# Patient Record
Sex: Male | Born: 1985 | Race: White | Hispanic: No | Marital: Single | Smoking: Current every day smoker
Health system: Southern US, Community
[De-identification: ages and names within clinical notes are randomized; demographics above are authoritative.]

## PROBLEM LIST (undated history)

## (undated) DIAGNOSIS — F419 Anxiety disorder, unspecified: Secondary | ICD-10-CM

---

## 2006-09-11 ENCOUNTER — Emergency Department (HOSPITAL_COMMUNITY): Admission: EM | Admit: 2006-09-11 | Discharge: 2006-09-11 | Payer: Self-pay | Admitting: Emergency Medicine

## 2010-12-25 ENCOUNTER — Other Ambulatory Visit: Payer: Self-pay | Admitting: Internal Medicine

## 2010-12-28 ENCOUNTER — Ambulatory Visit
Admission: RE | Admit: 2010-12-28 | Discharge: 2010-12-28 | Disposition: A | Discharge status: Home / Self Care | Payer: No Typology Code available for payment source | Source: Ambulatory Visit | Attending: Internal Medicine | Admitting: Internal Medicine

## 2011-03-10 ENCOUNTER — Emergency Department (HOSPITAL_COMMUNITY)
Admission: EM | Admit: 2011-03-10 | Discharge: 2011-03-10 | Disposition: A | Discharge status: Home / Self Care | Payer: Self-pay | Attending: Emergency Medicine | Admitting: Emergency Medicine

## 2011-03-10 ENCOUNTER — Emergency Department (HOSPITAL_COMMUNITY): Payer: Self-pay

## 2011-03-10 DIAGNOSIS — X58XXXA Exposure to other specified factors, initial encounter: Secondary | ICD-10-CM | POA: Insufficient documentation

## 2011-03-10 DIAGNOSIS — IMO0002 Reserved for concepts with insufficient information to code with codable children: Secondary | ICD-10-CM | POA: Insufficient documentation

## 2011-03-10 DIAGNOSIS — M25519 Pain in unspecified shoulder: Secondary | ICD-10-CM | POA: Insufficient documentation

## 2011-06-20 ENCOUNTER — Emergency Department (HOSPITAL_COMMUNITY): Payer: Self-pay

## 2011-06-20 ENCOUNTER — Other Ambulatory Visit: Payer: Self-pay

## 2011-06-20 ENCOUNTER — Emergency Department (HOSPITAL_COMMUNITY)
Admission: EM | Admit: 2011-06-20 | Discharge: 2011-06-21 | Disposition: A | Discharge status: Home / Self Care | Payer: Self-pay | Attending: Emergency Medicine | Admitting: Emergency Medicine

## 2011-06-20 DIAGNOSIS — F17200 Nicotine dependence, unspecified, uncomplicated: Secondary | ICD-10-CM | POA: Insufficient documentation

## 2011-06-20 DIAGNOSIS — R079 Chest pain, unspecified: Secondary | ICD-10-CM | POA: Insufficient documentation

## 2011-06-20 DIAGNOSIS — R0602 Shortness of breath: Secondary | ICD-10-CM | POA: Insufficient documentation

## 2011-06-20 DIAGNOSIS — F410 Panic disorder [episodic paroxysmal anxiety] without agoraphobia: Secondary | ICD-10-CM | POA: Insufficient documentation

## 2011-06-20 DIAGNOSIS — R000 Tachycardia, unspecified: Secondary | ICD-10-CM | POA: Insufficient documentation

## 2011-06-20 MED ORDER — LORAZEPAM 2 MG/ML IJ SOLN
INTRAMUSCULAR | Status: AC
  Administered 2011-06-20: 2 mg
  Filled 2011-06-20: qty 1

## 2011-06-20 MED ORDER — ASPIRIN 325 MG PO TABS
325.0000 mg | ORAL_TABLET | ORAL | Status: AC
  Administered 2011-06-20: 325 mg via ORAL
  Filled 2011-06-20: qty 1

## 2011-06-20 MED ORDER — NITROGLYCERIN 0.4 MG SL SUBL: 0.4000 mg | SUBLINGUAL_TABLET | SUBLINGUAL | Status: DC | PRN

## 2011-06-20 NOTE — ED Provider Notes (Signed)
History     CSN: [id number] Arrival date & time: 06/20/2011 11:17 PM   First MD Initiated Contact with Patient 06/20/11 2338      Chief Complaint  Patient presents with  . Chest Pain    (Consider location/radiation/quality/duration/timing/severity/associated sxs/prior treatment) HPI This is a 25 year old Arabic male who was eating dinner just prior to arrival. He stood up and suddenly developed chest pain. The pain is located substernally and the left upper chest. It is described as pressure or tightness is moderate to severe in intensity. It is accompanied by shortness of breath, hyperventilation and tingling in his fingertips. It is also accompanied by tachycardia and anxiety. There are no known exacerbating or mitigating factors.   History reviewed. No pertinent past medical history.  History reviewed. No pertinent past surgical history.  History reviewed. No pertinent family history.  History  Substance Use Topics  . Smoking status: Current Everyday Smoker  . Smokeless tobacco: Not on file  . Alcohol Use: Yes      Review of Systems  All other systems reviewed and are negative.     Allergies  Review of patient's allergies indicates no known allergies.  Home Medications  No current outpatient prescriptions on file.  BP 127/62  Pulse 103  Temp(Src) 98.7 F (37.1 C) (Oral)  Resp 26  SpO2 98%  Physical Exam General: Well-developed, well-nourished male in no acute distress; appearance consistent with age of record HENT: normocephalic, atraumatic Eyes: pupils equal round and reactive to light; extraocular muscles intact Neck: supple Heart: regular rate and rhythm; no murmurs; tachycardic Lungs: clear to auscultation bilaterally; tachypnea Abdomen: soft; nontender; nondistended Extremities: No deformity; full range of motion; pulses normal Neurologic: Awake, alert; motor function intact in all extremities and symmetric; no facial droop Skin: Warm and  dry Psychiatric: Anxious; hyperventilating    ED Course  Procedures (including critical care time)    MDM   EKG Interpretation:  Date & Time: 06/20/2011 11:19 PM  Rate: 133  Rhythm: sinus tachycardia  QRS Axis: normal  Intervals: normal  ST/T Wave abnormalities: normal  Conduction Disutrbances:none  Narrative Interpretation:   Old EKG Reviewed: none available  Nursing notes and vitals signs, including pulse oximetry, reviewed.  Summary of this visit's results, reviewed by myself:  Labs:  Results for orders placed during the hospital encounter of 06/20/11  CBC      Component Value Range   WBC 16.9 (*) 4.0 - 10.5 (K/uL)   RBC 6.10 (*) 4.22 - 5.81 (MIL/uL)   Hemoglobin 19.6 (*) 13.0 - 17.0 (g/dL)   HCT 40.9 (*) 81.1 - 52.0 (%)   MCV 86.6  78.0 - 100.0 (fL)   MCH 32.1  26.0 - 34.0 (pg)   MCHC 37.1 (*) 30.0 - 36.0 (g/dL)   RDW 91.4  78.2 - 95.6 (%)   Platelets 176  150 - 400 (K/uL)  BASIC METABOLIC PANEL      Component Value Range   Sodium 135  135 - 145 (mEq/L)   Potassium 3.6  3.5 - 5.1 (mEq/L)   Chloride 96  96 - 112 (mEq/L)   CO2 19  19 - 32 (mEq/L)   Glucose, Bld 136 (*) 70 - 99 (mg/dL)   BUN 8  6 - 23 (mg/dL)   Creatinine, Ser 2.13  0.50 - 1.35 (mg/dL)   Calcium 08.6 (*) 8.4 - 10.5 (mg/dL)   GFR calc non Af Amer >90  >90 (mL/min)   GFR calc Af Amer >90  >90 (mL/min)  PRO B NATRIURETIC PEPTIDE      Component Value Range   BNP, POC 5.0  0 - 125 (pg/mL)  D-DIMER, QUANTITATIVE      Component Value Range   D-Dimer, Quant <0.22  0.00 - 0.48 (ug/mL-FEU)  POCT I-STAT TROPONIN I      Component Value Range   Troponin i, poc 0.00  0.00 - 0.08 (ng/mL)   Comment 3             Imaging Studies: Dg Chest 2 View  06/21/2011  *RADIOLOGY REPORT*  Clinical Data: 25 year old male with chest pain and shortness of breath.  CHEST - 2 VIEW  Comparison: None.  Findings: Upright AP and lateral views of the chest.  Mild elevation of the right hemidiaphragm.  Normal lung  volumes. Normal cardiac size and mediastinal contours.  The lungs are clear.  No pneumothorax or effusion. No acute osseous abnormality identified.  IMPRESSION: Negative, no acute cardiopulmonary abnormality.  Original Report Authenticated By: Harley Hallmark, M.D.   2:30 AM Patient symptoms resolved after Ativan 1 mg IV. He remains tachycardic but less so than earlier. No evidence of acute cardiac or pulmonary etiology.             Hanley Seamen, MD 06/21/11 [phone]

## 2011-06-20 NOTE — ED Notes (Signed)
Pt was sitting somewhere and stood up suddenly grabbing his chest and complaining of pain and shortness of breath

## 2011-06-21 LAB — POCT I-STAT TROPONIN I: Troponin i, poc: 0 ng/mL (ref 0.00–0.08)

## 2011-06-21 LAB — CBC
HCT: 52.8 % — ABNORMAL HIGH (ref 39.0–52.0)
Platelets: 176 10*3/uL (ref 150–400)
RDW: 12 % (ref 11.5–15.5)
WBC: 16.9 10*3/uL — ABNORMAL HIGH (ref 4.0–10.5)

## 2011-06-21 LAB — BASIC METABOLIC PANEL
BUN: 8 mg/dL (ref 6–23)
Chloride: 96 mEq/L (ref 96–112)
GFR calc Af Amer: >90 mL/min (ref >90)
Potassium: 3.6 mEq/L (ref 3.5–5.1)

## 2011-06-21 LAB — PRO B NATRIURETIC PEPTIDE: Pro B Natriuretic peptide (BNP): 5 pg/mL (ref 0–125)

## 2011-06-21 LAB — D-DIMER, QUANTITATIVE: D-Dimer, Quant: <0.22 ug/mL-FEU (ref 0.00–0.48)

## 2011-06-21 NOTE — ED Notes (Signed)
Pt given discharge instructions and verbalizes understanding  

## 2011-06-21 NOTE — ED Notes (Signed)
Pt resting on stretcher. Pt states he feels a lot better. Pt cont to await further dispo. Family at bedside. Will cont to monitor

## 2011-06-21 NOTE — ED Notes (Signed)
Patient denies pain and is resting comfortably.  

## 2011-06-21 NOTE — ED Notes (Signed)
Pt transported to radiology via stretcher. Pt stable at time of transfer  

## 2011-08-10 ENCOUNTER — Other Ambulatory Visit: Payer: Self-pay

## 2011-08-10 ENCOUNTER — Encounter (HOSPITAL_COMMUNITY): Payer: Self-pay | Admitting: *Deleted

## 2011-08-10 ENCOUNTER — Emergency Department (HOSPITAL_COMMUNITY)
Admission: EM | Admit: 2011-08-10 | Discharge: 2011-08-11 | Disposition: A | Discharge status: Home / Self Care | Payer: Self-pay | Attending: Emergency Medicine | Admitting: Emergency Medicine

## 2011-08-10 DIAGNOSIS — R5383 Other fatigue: Secondary | ICD-10-CM | POA: Insufficient documentation

## 2011-08-10 DIAGNOSIS — F17200 Nicotine dependence, unspecified, uncomplicated: Secondary | ICD-10-CM | POA: Insufficient documentation

## 2011-08-10 DIAGNOSIS — F411 Generalized anxiety disorder: Secondary | ICD-10-CM | POA: Insufficient documentation

## 2011-08-10 DIAGNOSIS — R5381 Other malaise: Secondary | ICD-10-CM | POA: Insufficient documentation

## 2011-08-10 DIAGNOSIS — R42 Dizziness and giddiness: Secondary | ICD-10-CM | POA: Insufficient documentation

## 2011-08-10 DIAGNOSIS — R634 Abnormal weight loss: Secondary | ICD-10-CM | POA: Insufficient documentation

## 2011-08-10 HISTORY — DX: Anxiety disorder, unspecified: F41.9

## 2011-08-10 LAB — POCT I-STAT, CHEM 8
HCT: 54 % — ABNORMAL HIGH (ref 39.0–52.0)
Hemoglobin: 18.4 g/dL — ABNORMAL HIGH (ref 13.0–17.0)
Potassium: 4 mEq/L (ref 3.5–5.1)
Sodium: 140 mEq/L (ref 135–145)

## 2011-08-10 LAB — CBC
MCHC: 35.4 g/dL (ref 30.0–36.0)
Platelets: 266 10*3/uL (ref 150–400)
RDW: 12.4 % (ref 11.5–15.5)

## 2011-08-10 NOTE — ED Notes (Signed)
Pt states he is dizzy x 1 day and is lethargic.  Pt also complains of right leg trembling.  Pt confirms hx of panic and anxiety.  Pt cannot recall what in particular made him have said panic or anxiety today.

## 2011-08-10 NOTE — ED Provider Notes (Signed)
History     CSN: [id number]  Arrival date & time 08/10/11  2052   First MD Initiated Contact with Patient 08/10/11 2158      Chief Complaint  Patient presents with  . Weakness  . Dizziness    (Consider location/radiation/quality/duration/timing/severity/associated sxs/prior treatment) HPI Comments: Patient states chief complaint of dizziness and lethargic times one day.  He states he has a history of anxiety however this does not feel like anxiety 10.  Patient is a current smoker.  In addition patient states that he has had weight loss over the last 6 months.  Patient denies change in appetite, shortness of breath, leg swelling, headache, nausea, vomiting, diarrhea or abdominal pain.  Patient is a 26 y.o. male presenting with weakness. The history is provided by the patient.  Weakness The primary symptoms include dizziness. Primary symptoms do not include headaches, syncope, loss of consciousness, altered mental status, seizures, visual change, paresthesias, focal weakness, loss of sensation, speech change, memory loss, fever, nausea or vomiting.  Dizziness also occurs with weakness. Dizziness does not occur with tinnitus, nausea or vomiting.   Additional symptoms include weakness. Additional symptoms do not include neck stiffness, pain, lower back pain, leg pain, loss of balance, photophobia, aura, hallucinations, nystagmus, taste disturbance, hyperacusis, hearing loss, tinnitus, vertigo, anxiety, irritability or dysphoric mood. Medical issues do not include seizures, cerebral vascular accident, cancer, alcohol use, drug use, diabetes, hypertension or recent surgery.    Past Medical History  Diagnosis Date  . Anxiety     and panic    History reviewed. No pertinent past surgical history.  History reviewed. No pertinent family history.  History  Substance Use Topics  . Smoking status: Current Everyday Smoker -- 0.5 packs/day for 12 years    Types: Cigarettes  . Smokeless  tobacco: Not on file  . Alcohol Use: No      Review of Systems  Constitutional: Positive for unexpected weight change. Negative for fever and irritability.  HENT: Negative for hearing loss, neck stiffness and tinnitus.   Eyes: Negative for photophobia.  Cardiovascular: Negative for syncope.  Gastrointestinal: Negative for nausea and vomiting.  Neurological: Positive for dizziness and weakness. Negative for vertigo, speech change, focal weakness, seizures, loss of consciousness, headaches, paresthesias and loss of balance.  Psychiatric/Behavioral: Negative for hallucinations, memory loss, dysphoric mood and altered mental status.    Allergies  Review of patient's allergies indicates no known allergies.  Home Medications  No current outpatient prescriptions on file.  BP 135/76  Pulse 93  Temp(Src) 97.8 F (36.6 C) (Oral)  Resp 16  SpO2 100%  Physical Exam  ED Course  Procedures (including critical care time)  Labs Reviewed  CBC - Abnormal; Notable for the following:    WBC 14.2 (*)    Hemoglobin 17.5 (*)    All other components within normal limits  POCT I-STAT, CHEM 8 - Abnormal; Notable for the following:    Glucose, Bld 103 (*)    Calcium, Ion 1.11 (*)    Hemoglobin 18.4 (*)    HCT 54.0 (*)    All other components within normal limits  I-STAT, CHEM 8   No results found.   Date: 08/11/2011  Rate: 80  Rhythm: normal sinus rhythm  QRS Axis: normal  Intervals: normal  ST/T Wave abnormalities: normal  Conduction Disutrbances:none  Narrative Interpretation:   Old EKG Reviewed: No significant changes noted   No diagnosis found.   Spoke with pt in length about importance to follow up with  his weigh tloss. Pt states he understands that he needs to get worked up for this and he will [facility]. Pt did not have any ataxia, CN deficits, visual problems w TM ear canals. His labs have not changed since his last visit and do not appear concerning. Labs did not  indicate anemia and pt has no reason to be dehydrated. Pt is being dc w instructions to f-u asap for a further wk up. Pt is agreeable to plan. He is stable and does not have any complaints currently   MDM  Dizziness and weight loss        [place], [place] 08/11/11 0007

## 2011-08-10 NOTE — ED Notes (Signed)
Pt c/o weakness and dizziness since yesterday.

## 2011-08-11 NOTE — ED Provider Notes (Signed)
Medical screening examination/treatment/procedure(s) were performed by non-physician practitioner and as supervising physician I was immediately available for consultation/collaboration.  Kwane Rohl P Shaul Trautman, MD 08/11/11 0020 

## 2012-07-17 ENCOUNTER — Emergency Department (HOSPITAL_COMMUNITY)
Admission: EM | Admit: 2012-07-17 | Discharge: 2012-07-17 | Disposition: A | Discharge status: Home / Self Care | Payer: Self-pay | Source: Home / Self Care

## 2012-07-17 ENCOUNTER — Encounter (HOSPITAL_COMMUNITY): Payer: Self-pay

## 2012-07-17 DIAGNOSIS — R42 Dizziness and giddiness: Secondary | ICD-10-CM

## 2012-07-17 LAB — CBC
HCT: 52.6 % — ABNORMAL HIGH (ref 39.0–52.0)
Hemoglobin: 18.9 g/dL — ABNORMAL HIGH (ref 13.0–17.0)
RDW: 12 % (ref 11.5–15.5)
WBC: 10.8 10*3/uL — ABNORMAL HIGH (ref 4.0–10.5)

## 2012-07-17 LAB — BASIC METABOLIC PANEL
Chloride: 98 mEq/L (ref 96–112)
GFR calc Af Amer: >90 mL/min (ref >90)
GFR calc non Af Amer: >90 mL/min (ref >90)
Potassium: 4.1 mEq/L (ref 3.5–5.1)

## 2012-07-17 NOTE — ED Notes (Signed)
History of DM here for check up

## 2012-07-17 NOTE — ED Provider Notes (Signed)
History     CSN: [id number]  Arrival date & time 07/17/12  1103   First MD Initiated Contact with Patient 07/17/12 1228      Chief Complaint  Patient presents with  . Blood Sugar Problem    (Consider location/radiation/quality/duration/timing/severity/associated sxs/prior treatment) HPI 34th-year-old male with no past medical history here for her symptoms off dizziness off and on. Patient informs that he gets dizzy and lightheaded at work and feels better after eating some candy or sweet food. He informs that he check his blood glucose during such occasion and was ranging from 80-90. He denies any palpitations, sweating, headache, blurry vision, chest pain, shortness of breath, tingling or numbness of extremities. He denies abdominal pain, nausea, vomiting, bowel or urinary symptoms. He denies any polyuria , polydipsia or dry mouth. He denies any weakness. Denies being on any medications. Past Medical History  Diagnosis Date  . Anxiety     and panic    History reviewed. No pertinent past surgical history.  No family history on file.  History  Substance Use Topics  . Smoking status: Current Every Day Smoker -- 0.5 packs/day for 12 years    Types: Cigarettes  . Smokeless tobacco: Not on file  . Alcohol Use: No      Review of Systems As outlined in history of present illness  Allergies  Review of patient's allergies indicates no known allergies.  Home Medications  No current outpatient prescriptions on file.  BP 128/81  Pulse 84  Temp 97.9 F (36.6 C) (Oral)  Resp 19  SpO2 100%  Physical Exam Middle aged male in no acute distress HEENT: No pallor, no icterus, moist oral mucosa, no lymphadenopathy, no thyromegaly Chest: Clear to auscultation bilaterally no added sounds and CVS: Normal S1-S2 no murmurs rub or gallop Abdomen: Soft, nontender, nondistended bowel sounds present Extremities: Warm, no edema CNS: AAO X3  ED Course  Procedures (including critical  care time)  Labs Reviewed - No data to display No results found. CBC and BMP ordered  No diagnosis found.  Dizziness Patient informs of having hypoglycemic symptoms off and on and says his random blood glucose  range between 80-90 and improves after eating some sugar.Marland Kitchen His blood pressure is stable. I will check a CBC to rule out anemia and then get a BMP. Medication needed at this time. Patient counseled strongly on smoking cessation.  MDM  Follow up as needed        Eddie North, MD 07/17/12 1252

## 2012-08-28 ENCOUNTER — Encounter (HOSPITAL_COMMUNITY): Payer: Self-pay

## 2012-08-28 ENCOUNTER — Emergency Department (HOSPITAL_COMMUNITY)
Admission: EM | Admit: 2012-08-28 | Discharge: 2012-08-28 | Disposition: A | Discharge status: Home / Self Care | Payer: Self-pay | Source: Home / Self Care | Attending: Family Medicine | Admitting: Family Medicine

## 2012-08-28 DIAGNOSIS — D45 Polycythemia vera: Secondary | ICD-10-CM

## 2012-08-28 DIAGNOSIS — L0291 Cutaneous abscess, unspecified: Secondary | ICD-10-CM

## 2012-08-28 DIAGNOSIS — D751 Secondary polycythemia: Secondary | ICD-10-CM

## 2012-08-28 LAB — CBC WITH DIFFERENTIAL/PLATELET
Eosinophils Absolute: 0.2 10*3/uL (ref 0.0–0.7)
Hemoglobin: 18.9 g/dL — ABNORMAL HIGH (ref 13.0–17.0)
Lymphocytes Relative: 31 % (ref 12–46)
Lymphs Abs: 3.9 10*3/uL (ref 0.7–4.0)
Monocytes Relative: 10 % (ref 3–12)
Neutro Abs: 7.1 10*3/uL (ref 1.7–7.7)
Neutrophils Relative %: 57 % (ref 43–77)
Platelets: 296 10*3/uL (ref 150–400)
RBC: 5.74 MIL/uL (ref 4.22–5.81)
WBC: 12.6 10*3/uL — ABNORMAL HIGH (ref 4.0–10.5)

## 2012-08-28 LAB — IRON AND TIBC
Iron: 202 ug/dL — ABNORMAL HIGH (ref 42–135)
Saturation Ratios: 51 % (ref 20–55)
TIBC: 396 ug/dL (ref 215–435)
UIBC: 194 ug/dL (ref 125–400)

## 2012-08-28 LAB — RETICULOCYTES
RBC.: 5.74 MIL/uL (ref 4.22–5.81)
Retic Ct Pct: 1.4 % (ref 0.4–3.1)

## 2012-08-28 LAB — GLUCOSE, CAPILLARY: Glucose-Capillary: 279 mg/dL — ABNORMAL HIGH (ref 70–99)

## 2012-08-28 LAB — VITAMIN B12: Vitamin B-12: 507 pg/mL (ref 211–911)

## 2012-08-28 NOTE — ED Provider Notes (Signed)
History     CSN: [id number]  Arrival date & time 08/28/12  1217   First MD Initiated Contact with Patient 08/28/12 1327     Chief Complaint  Patient presents with  . Follow-up   HPI Pt presents to follow up for his elevated hemoglobin level seen on his labs in December.  He also has an abscess on his left side that he has said has been draining some pus and blood.   Past Medical History  Diagnosis Date  . Anxiety     and panic    History reviewed. No pertinent past surgical history.  No family history on file.  History  Substance Use Topics  . Smoking status: Current Every Day Smoker -- 0.5 packs/day for 12 years    Types: Cigarettes  . Smokeless tobacco: Not on file  . Alcohol Use: No    Review of Systems  Constitutional: Positive for fatigue.  HENT: Positive for congestion.   Skin: Positive for wound.  All other systems reviewed and are negative.    Allergies  Review of patient's allergies indicates no known allergies.  Home Medications  No current outpatient prescriptions on file.  BP 122/86  Pulse 81  Temp 98.2 F (36.8 C) (Oral)  Resp 17  SpO2 100%  Physical Exam  Nursing note and vitals reviewed. Constitutional: He is oriented to person, place, and time. He appears well-developed and well-nourished. No distress.  HENT:  Head: Normocephalic and atraumatic.  Eyes: EOM are normal. Pupils are equal, round, and reactive to light.  Cardiovascular: Normal rate, regular rhythm and normal heart sounds.   Pulmonary/Chest: Effort normal and breath sounds normal.  Abdominal: Soft. Bowel sounds are normal. He exhibits no distension and no mass. There is no tenderness. There is no rebound and no guarding.  Musculoskeletal: Normal range of motion. He exhibits no edema and no tenderness.  Neurological: He is alert and oriented to person, place, and time.  Skin: Skin is warm and dry. Rash noted. There is erythema.          Abscess on left side of lower part of  trunk on abdomen draining bloody, purulent material  Psychiatric: He has a normal mood and affect. His behavior is normal. Judgment and thought content normal.    ED Course  INCISION AND DRAINAGE Date/Time: 08/28/2012 2:12 PM Performed by: Cleora Fleet Authorized by: Cleora Fleet Consent: Verbal consent obtained. Written consent not obtained. Risks and benefits: risks, benefits and alternatives were discussed Consent given by: patient Patient understanding: patient states understanding of the procedure being performed Patient consent: the patient's understanding of the procedure matches consent given Procedure consent: procedure consent matches procedure scheduled Type: abscess Body area: trunk Location details: abdomen Anesthesia: local infiltration Local anesthetic: lidocaine 2% without epinephrine Anesthetic total: 0.5 ml Patient sedated: no Incision type: single straight Complexity: simple Drainage: purulent, bloody, serous and serosanguinous Drainage amount: moderate Wound treatment: drain placed and wound left open Packing material: 1/2 in iodoform gauze Patient tolerance: Patient tolerated the procedure well with no immediate complications.   No results found.  No diagnosis found.  MDM  IMPRESSION  Elevated Hemoglobin level (? polycythemia)  Skin abscess  RECOMMENDATIONS / PLAN I&D of abscess performed Check CBC, iron studies Send pt to hematologist if abnormal  FOLLOW UP 3 months   The patient was given clear instructions to go to ER or return to medical center if symptoms don't improve, worsen or new problems develop.  The patient verbalized  understanding.  The patient was told to call to get lab results if they haven't heard anything in the next week.            Cleora Fleet, MD 08/28/12 [phone]

## 2012-08-28 NOTE — ED Notes (Signed)
Follow up states he needs lab work

## 2012-08-28 NOTE — ED Notes (Signed)
CBG logged at 1330 hours is not for this patient - wrong MRN entered. POC office notified of same, and edit sheet sent.

## 2012-08-29 NOTE — Progress Notes (Signed)
Quick Note:  Please notify patient that his white blood cell count is elevated (which can be a result of infection ), His hemoglobin continues to be elevated higher than normal and his iron levels are high. I recommend that he see a hematologist to be evaluated for possible polycythemia vera. This is a blood disorder and this will need treatment if you have this condition. Please refer patient to hematologist 1st available appointment. Recommend rechecking CBC in 1 month.    Rodney Langton, MD, CDE, FAAFP [facility] [place], [place]   ______

## 2012-08-31 NOTE — Progress Notes (Signed)
Patient stopped in office and lab results given

## 2012-09-02 NOTE — ED Notes (Signed)
Referral sent to cancer center for elevated hgb waiting on an appt

## 2012-09-03 ENCOUNTER — Telehealth: Payer: Self-pay | Admitting: Oncology

## 2012-09-03 NOTE — Telephone Encounter (Signed)
C/D 09/03/12 for appt.09/15/12

## 2012-09-03 NOTE — Telephone Encounter (Signed)
S/W PT IN REF TO NP APPT. ON 09/15/12@1 :30 REFERRING DR Laural Benes DX-POLYCYTHEMIA VERA MAILED NP PACKET

## 2012-09-04 NOTE — ED Notes (Signed)
PATIENT has an appt. 09/15/12 @ 2pm arrival time @ 1:30p

## 2012-09-11 ENCOUNTER — Other Ambulatory Visit: Payer: Self-pay | Admitting: Oncology

## 2012-09-11 DIAGNOSIS — D751 Secondary polycythemia: Secondary | ICD-10-CM

## 2012-09-15 ENCOUNTER — Ambulatory Visit (HOSPITAL_BASED_OUTPATIENT_CLINIC_OR_DEPARTMENT_OTHER): Payer: No Typology Code available for payment source | Admitting: Oncology

## 2012-09-15 ENCOUNTER — Other Ambulatory Visit (HOSPITAL_BASED_OUTPATIENT_CLINIC_OR_DEPARTMENT_OTHER): Payer: No Typology Code available for payment source | Admitting: Lab

## 2012-09-15 ENCOUNTER — Ambulatory Visit: Payer: No Typology Code available for payment source

## 2012-09-15 ENCOUNTER — Encounter: Payer: Self-pay | Admitting: Oncology

## 2012-09-15 ENCOUNTER — Telehealth: Payer: Self-pay | Admitting: Oncology

## 2012-09-15 VITALS — BP 140/86 | HR 80 | Temp 96.7°F | Resp 20 | Ht 71.0 in | Wt 247.6 lb

## 2012-09-15 DIAGNOSIS — R634 Abnormal weight loss: Secondary | ICD-10-CM

## 2012-09-15 DIAGNOSIS — D751 Secondary polycythemia: Secondary | ICD-10-CM

## 2012-09-15 DIAGNOSIS — D45 Polycythemia vera: Secondary | ICD-10-CM

## 2012-09-15 LAB — CBC WITH DIFFERENTIAL/PLATELET
Basophils Absolute: 0 10*3/uL (ref 0.0–0.1)
Eosinophils Absolute: 0.3 10*3/uL (ref 0.0–0.5)
HCT: 49.7 % (ref 38.4–49.9)
HGB: 17.3 g/dL — ABNORMAL HIGH (ref 13.0–17.1)
MCV: 91 fL (ref 79.3–98.0)
MONO%: 9.5 % (ref 0.0–14.0)
NEUT#: 6.1 10*3/uL (ref 1.5–6.5)
NEUT%: 62.3 % (ref 39.0–75.0)
RDW: 12.5 % (ref 11.0–14.6)
lymph#: 2.4 10*3/uL (ref 0.9–3.3)

## 2012-09-15 LAB — COMPREHENSIVE METABOLIC PANEL (CC13)
Albumin: 3.9 g/dL (ref 3.5–5.0)
BUN: 8.4 mg/dL (ref 7.0–26.0)
Calcium: 10 mg/dL (ref 8.4–10.4)
Chloride: 102 mEq/L (ref 98–107)
Creatinine: 0.9 mg/dL (ref 0.7–1.3)
Glucose: 184 mg/dl — ABNORMAL HIGH (ref 70–99)
Potassium: 4.4 mEq/L (ref 3.5–5.1)

## 2012-09-15 NOTE — Progress Notes (Signed)
Note dictated

## 2012-09-15 NOTE — Progress Notes (Signed)
Checked in new pt with no financial concerns. °

## 2012-09-15 NOTE — Telephone Encounter (Signed)
gv and printed appt schedule for pt for Feb and March...gv pt barium...pt aware central scheduling will call with ct

## 2012-09-16 NOTE — Progress Notes (Signed)
CC:   Standley Dakins, MD  REASON FOR CONSULTATION:  Polycythemia.  HISTORY OF PRESENT ILLNESS:  This is a pleasant 27 year old gentleman native of [place]; though born in [place], raised in [place], and apparently of [place] for the last 10 years.  He is a relatively healthy gentleman.  He has had a history of obesity, he is close to 300 pounds; however, has lost about 50 pounds in the last 6 months unintentionally. He is not necessarily feeling poorly, but has had a trip to the emergency department in December 2013 for what he describes as a panic attack.  At that time, he had a CBC done which showed a hemoglobin of 18.9, white cell count of 10.8, and platelet count of 287.  Followup CBC on August 28, 2012, showed a hemoglobin of 18.9, white cell count of 12.6, platelet count of 296.  His MCV was normal at 89.7.  He had a normal differential at that time.  His TSH was 2.1.  Otherwise, chemistries all appeared within normal range.  For that reason, the patient was referred to me for evaluation for possible polycythemia. Clinically, he feels relatively fair.  He is not reporting any chest pain.  He is not reporting any difficulty breathing.  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  He denies any multivitamin supplements.  He has not had any testosterone or any hormonal supplements.  He is not aware of obstructive sleep apnea symptoms.  REVIEW OF SYSTEMS:  Does not report any headaches, blurry vision, double vision.  Does not report any motor or sensory neuropathy.  Does not report any alteration in mental status.  Does not report any psychiatric issues, depression.  Does not report any fever, chills, sweats.  Does not report any cough, hemoptysis, hematemesis.  No nausea or vomiting. Does not report any abdominal pain.  No hematochezia, melena, or genitourinary complaints.  Rest of review of systems is  unremarkable.  PAST MEDICAL HISTORY:  Really unremarkable.  He denied any history of hypertension, diabetes.  He did have elevated sugars in the past, but no formal diagnosis of diabetes had been given.  MEDICATIONS:  None.  He denied any over-the-counter supplements or any home remedies.  ALLERGIES:  None.  SOCIAL HISTORY:  He is single.  He smokes cigarettes daily, close to half a pack to a pack.  Denied any alcohol or __________ abuse.  FAMILY HISTORY:  History of diabetes and hypertension in his family.  No history of any blood disorders that he can recall in his family or any cancers in the immediate family.  His extended family has had cancer, although he is not clear exactly what.  PHYSICAL EXAMINATION:  General:  Alert, awake, pleasant appearing gentleman, appeared in no active distress.  Blood pressure is 140/86, pulse 80, respirations 20, temperature 96 weight is 247 pounds.  HEENT: Head is normocephalic, atraumatic.  Pupils equal, round, reactive to light.  Oral mucosa moist and pink.  Neck:  Supple without lymphadenopathy.  Heart:  Regular rate and rhythm.  S1 and S2.  Lungs: Clear to auscultation.  No rhonchi, wheeze, or dullness to percussion. Abdomen:  Soft, nontender.  No hepatosplenomegaly.  Extremities:  No clubbing, cyanosis, or edema.  Neurological:  Intact motor, sensory, and deep tendon reflexes.  LABORATORY DATA:  Today showed a hemoglobin of 17.3, upper limit of normal is 17.1.  He had normal white cell count  of 9.8, platelet count of 256.  His differential was completely normal.  His peripheral smear was completely normal.  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  Differential diagnosis today discussed in detail with Mr. Dunlevy.  The secondary causes of polycythemia were discussed including obesity, possible sleep apnea, which he is exhibiting some signs and symptoms of that.  Could also be related to  smoking history and chronic smoke exposure.  Also, an erythropoietin secreting tumor is definitely a possibility, given his weight loss as well.  Primary cause such as polycythemia vera I think is less likely, given the fact that his counts are close to normal at this point.  To work this up I will obtain a JAK2 mutation to rule that out definitely and also obtain an erythropoietin level.  I would like to obtain also a CT scan of the chest, abdomen, and pelvis to rule out an indolent malignancy that cause reactive erythrocytosis, given his unexplained and rather dramatic weight loss at this time.  All his questions were answered today.  I will ask him to come back for followup after he obtains these tests.    ______________________________ Benjiman Core, M.D. FNS/MEDQ  D:  09/15/2012  T:  09/16/2012  Job:  [id number]

## 2012-09-28 ENCOUNTER — Telehealth: Payer: Self-pay | Admitting: Dietician

## 2012-09-29 ENCOUNTER — Encounter (HOSPITAL_COMMUNITY): Payer: Self-pay

## 2012-09-29 ENCOUNTER — Ambulatory Visit (HOSPITAL_COMMUNITY)
Admission: RE | Admit: 2012-09-29 | Discharge: 2012-09-29 | Disposition: A | Discharge status: Home / Self Care | Payer: No Typology Code available for payment source | Source: Ambulatory Visit | Attending: Oncology | Admitting: Oncology

## 2012-09-29 DIAGNOSIS — R634 Abnormal weight loss: Secondary | ICD-10-CM | POA: Insufficient documentation

## 2012-09-29 DIAGNOSIS — D45 Polycythemia vera: Secondary | ICD-10-CM | POA: Insufficient documentation

## 2012-09-29 MED ORDER — IOHEXOL 300 MG/ML  SOLN
100.0000 mL | Freq: Once | INTRAMUSCULAR | Status: AC | PRN
  Administered 2012-09-29: 100 mL via INTRAVENOUS

## 2012-10-06 ENCOUNTER — Encounter: Payer: Self-pay | Admitting: Family Medicine

## 2012-10-06 ENCOUNTER — Ambulatory Visit (HOSPITAL_BASED_OUTPATIENT_CLINIC_OR_DEPARTMENT_OTHER): Payer: No Typology Code available for payment source | Admitting: Oncology

## 2012-10-06 VITALS — BP 135/76 | HR 97 | Temp 96.8°F | Resp 20 | Ht 71.0 in | Wt 248.6 lb

## 2012-10-06 DIAGNOSIS — D751 Secondary polycythemia: Secondary | ICD-10-CM

## 2012-10-06 DIAGNOSIS — F17200 Nicotine dependence, unspecified, uncomplicated: Secondary | ICD-10-CM | POA: Insufficient documentation

## 2012-10-06 NOTE — Progress Notes (Signed)
Hematology and Oncology Follow Up Visit  Matthew Stephens [id number] 1986/05/06 26 y.o. 10/06/2012 9:49 AM  Principle Diagnosis: 27 year old with polycythemia diagnosed in 09/2012, likely reactive with normal work up.   Interim History: Matthew Stephens presents today for a follow up visit. He is a nice young man I saw on 09/2012 for the work up of polycythemia. His Hgb at that time was around 17.3 (normal is 17). The rest of his work up was unremarkable.  Since his last visit he has been doing well. He reports eating well and actually he feels hungry every two hours.  No neurological symptoms.    Medications: I have reviewed the patient's current medications. No current outpatient prescriptions on file.  Allergies: No Known Allergies  Past Medical History, Surgical history, Social history, and Family History were reviewed and updated.  Review of Systems: Constitutional:  Negative for fever, chills, night sweats, anorexia, weight loss, pain. Cardiovascular: no chest pain or dyspnea on exertion Respiratory: negative Neurological: negative Dermatological: negative ENT: negative Skin: Negative. Gastrointestinal: negative Genito-Urinary: negative Hematological and Lymphatic: negative Breast: negative Musculoskeletal: negative Remaining ROS negative. Physical Exam: Blood pressure 135/76, pulse 97, temperature 96.8 F (36 C), temperature source Oral, resp. rate 20, height 5\' 11"  (1.803 m), weight 248 lb 9.6 oz (112.764 kg). ECOG: 0 General appearance: alert Head: Normocephalic, without obvious abnormality, atraumatic Neck: no adenopathy, no carotid bruit, no JVD, supple, symmetrical, trachea midline and thyroid not enlarged, symmetric, no tenderness/mass/nodules Lymph nodes: Cervical, supraclavicular, and axillary nodes normal. Heart:regular rate and rhythm, S1, S2 normal, no murmur, click, rub or gallop Lung:chest clear, no wheezing, rales, normal symmetric air entry Abdomin: soft,  non-tender, without masses or organomegaly EXT:no erythema, induration, or nodules   Lab Results: Lab Results  Component Value Date   WBC 9.8 09/15/2012   HGB 17.3* 09/15/2012   HCT 49.7 09/15/2012   MCV 91.0 09/15/2012   PLT 256 09/15/2012     Chemistry      Component Value Date/Time   NA 139 09/15/2012 1343   NA 138 07/17/2012 1248   K 4.4 09/15/2012 1343   K 4.1 07/17/2012 1248   CL 102 09/15/2012 1343   CL 98 07/17/2012 1248   CO2 30* 09/15/2012 1343   CO2 31 07/17/2012 1248   BUN 8.4 09/15/2012 1343   BUN 7 07/17/2012 1248   CREATININE 0.9 09/15/2012 1343   CREATININE 0.72 07/17/2012 1248      Component Value Date/Time   CALCIUM 10.0 09/15/2012 1343   CALCIUM 10.1 07/17/2012 1248   ALKPHOS 73 09/15/2012 1343   AST 17 09/15/2012 1343   ALT 25 09/15/2012 1343   BILITOT 0.62 09/15/2012 1343       Radiological Studies: CT CHEST, ABDOMEN AND PELVIS WITH CONTRAST  Technique: Multidetector CT imaging of the chest, abdomen and  pelvis was performed following the standard protocol during bolus  administration of intravenous contrast.  Contrast: OMNIPAQUE IOHEXOL 300 MG/ML SOLN  Comparison: None.  CT CHEST  Findings: No evidence of mediastinal or hilar lymphadenopathy. No  adenopathy seen elsewhere within the thorax. No evidence of chest  wall mass or suspicious bone lesions.  Both lungs are clear. No suspicious pulmonary nodules or masses  are identified. No evidence of pulmonary infiltrate or central  endobronchial lesion.  IMPRESSION:  Negative. No evidence of neoplasm or other significant  abnormality.  CT ABDOMEN AND PELVIS  Findings: The liver, gallbladder, pancreas, spleen, adrenal  glands, and kidneys are normal in appearance.  No evidence of  hydronephrosis. No soft tissue masses or lymphadenopathy  identified within the abdomen or pelvis.  No evidence of inflammatory process or abnormal fluid collections.  No evidence of bowel wall thickening, dilatation, or  hernia. No  suspicious bone lesions identified.  IMPRESSION:  Negative. No evidence of neoplasm or other significant  abnormality.    Impression and Plan:  This is a 27 year old gentleman with fluctuating polycythemia. Differential diagnosis includs obesity, possible sleep apnea, which he is exhibiting some signs and symptoms of that. Could also be related to smoking history.  Polycythemia vera I think is less likely. CT scan of the chest, abdomen, and pelvis to rule out an indolent malignancy was discussed today and was completely normal.  Overall, no further intervention is needed.  I advised him to cut down on smoking, hydrate better and try to loose wight.  I will be happy to see him in the future as needed.      [facility], MD 3/4/20149:49 AM

## 2012-11-18 ENCOUNTER — Ambulatory Visit: Payer: Self-pay | Admitting: Emergency Medicine

## 2012-11-18 VITALS — BP 136/88 | HR 90 | Temp 98.0°F | Resp 18 | Ht 69.5 in | Wt 246.0 lb

## 2012-11-18 DIAGNOSIS — Z0289 Encounter for other administrative examinations: Secondary | ICD-10-CM

## 2012-11-18 LAB — POCT URINALYSIS DIPSTICK
Leukocytes, UA: NEGATIVE
Nitrite, UA: NEGATIVE
Protein, UA: NEGATIVE
Urobilinogen, UA: NEGATIVE

## 2012-11-18 NOTE — Progress Notes (Signed)
[facility] [address], [place] [address] [phone]  Date:  11/18/2012   Name:  Matthew Stephens   DOB:  [birth year]   MRN:  [id number]  PCP:  Pcp Not In System    Chief Complaint: Employment Physical   History of Present Illness:  Matthew Stephens is a 27 y.o. very pleasant male patient who presents with the following:  DOT certification   Patient Active Problem List  Diagnosis  . Smoker  . Polycythemia    Past Medical History  Diagnosis Date  . Anxiety     and panic    No past surgical history on file.  History  Substance Use Topics  . Smoking status: Current Every Day Smoker -- 0.50 packs/day for 12 years    Types: Cigarettes  . Smokeless tobacco: Not on file  . Alcohol Use: No    No family history on file.  No Known Allergies  Medication list has been reviewed and updated.  No current outpatient prescriptions on file prior to visit.   No current facility-administered medications on file prior to visit.    Review of Systems:  As per HPI, otherwise negative.    Physical Examination: Filed Vitals:   11/18/12 1519  BP: 136/88  Pulse: 90  Temp: 98 F (36.7 C)  Resp: 18   Filed Vitals:   11/18/12 1519  Height: 5' 9.5" (1.765 m)  Weight: 246 lb (111.585 kg)   Body mass index is 35.82 kg/(m^2). Ideal Body Weight: Weight in (lb) to have BMI = 25: 171.4  GEN: WDWN, NAD, Non-toxic, A & O x 3 HEENT: Atraumatic, Normocephalic. Neck supple. No masses, No LAD. Ears and Nose: No external deformity. CV: RRR, No M/G/R. No JVD. No thrill. No extra heart sounds. PULM: CTA B, no wheezes, crackles, rhonchi. No retractions. No resp. distress. No accessory muscle use. ABD: S, NT, ND, +BS. No rebound. No HSM. EXTR: No c/c/e NEURO Normal gait.  PSYCH: Normally interactive. Conversant. Not depressed or anxious appearing.  Calm demeanor.    Assessment and Plan: DOT paperwork completed   Signed,  Phillips Odor,  MD

## 2013-11-24 IMAGING — CT CT ABD-PELV W/ CM
2 of 4 series · 17 of 46 positions shown, 19 images · IV contrast (OMNIPAQUE)
Comparison: None.

CT CHEST

CLINICAL DATA: Polycythemia.  60 pounds weight loss over past
year.

CT CHEST, ABDOMEN AND PELVIS WITH CONTRAST
TECHNIQUE: Multidetector CT imaging of the chest, abdomen and
pelvis was performed following the standard protocol during bolus
administration of intravenous contrast.
Contrast: 100mL OMNIPAQUE IOHEXOL 300 MG/ML  SOLN

[Series 2: cap with st · axial · 0.89mm/px · z∈[-670,-45]mm · 14 of 139 slices shown, 16 images]
[im 7/139  soft-tissue]
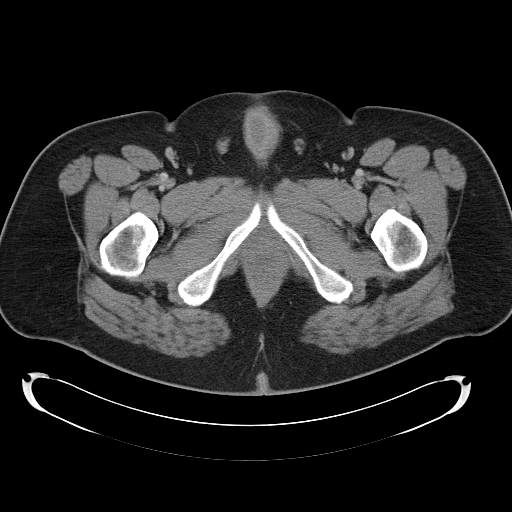
[im 7/139  bone]
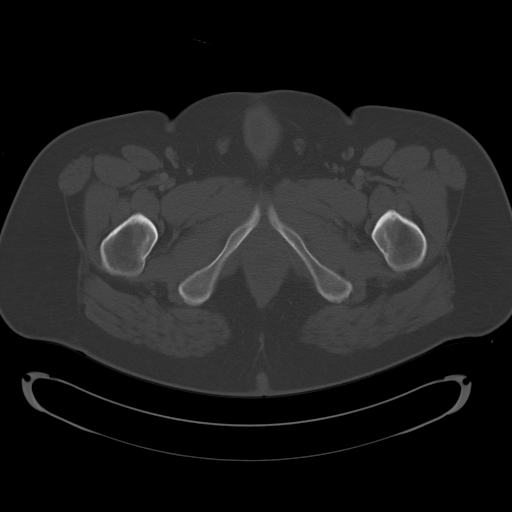
[im 20/139  soft-tissue]
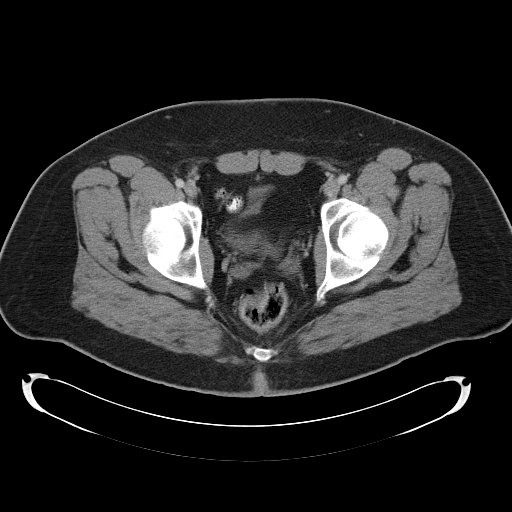
[im 27/139  soft-tissue]
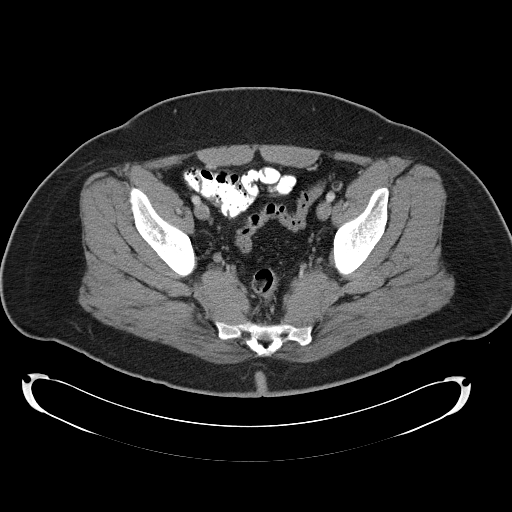
[im 40/139  soft-tissue]
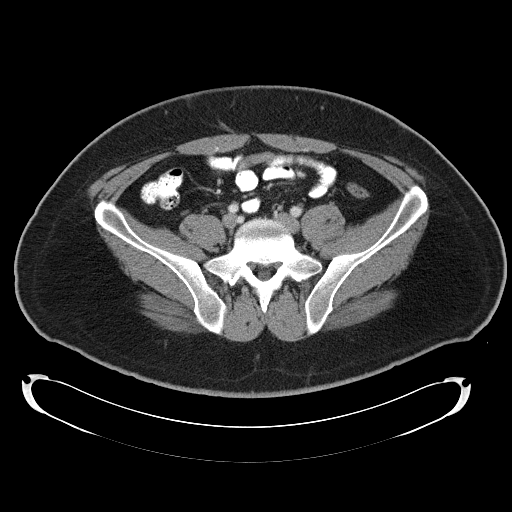
[im 47/139  soft-tissue]
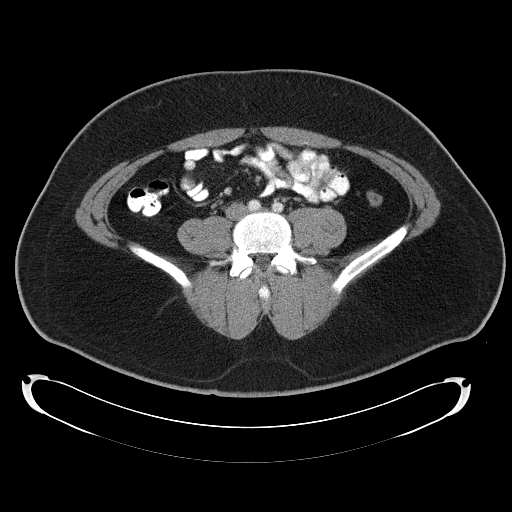
[im 53/139  soft-tissue]
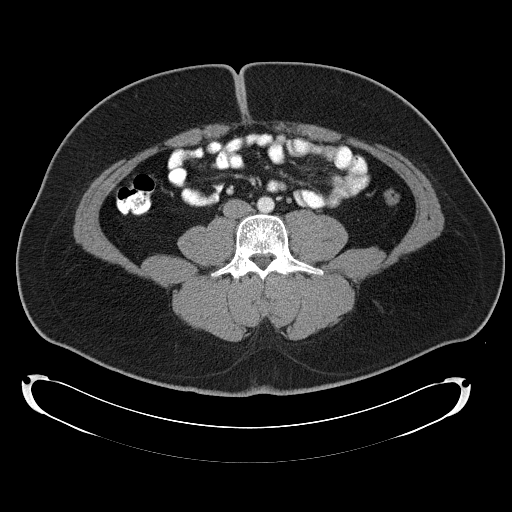
[im 66/139  soft-tissue]
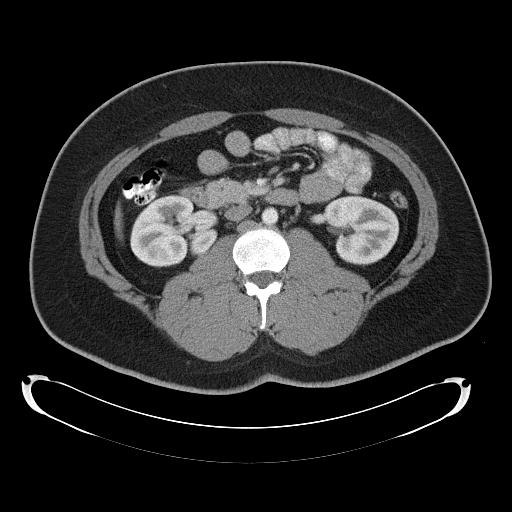
[im 73/139  soft-tissue]
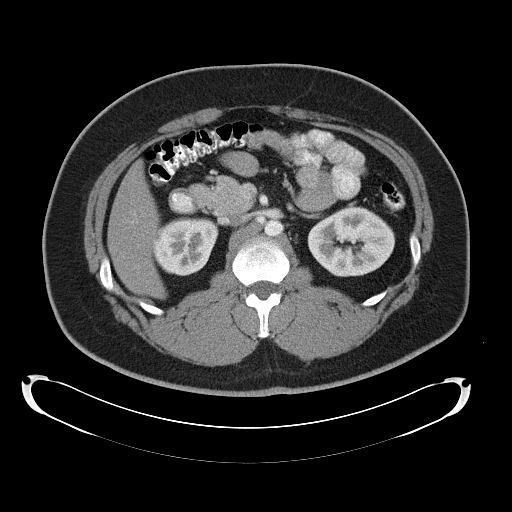
[im 86/139  soft-tissue]
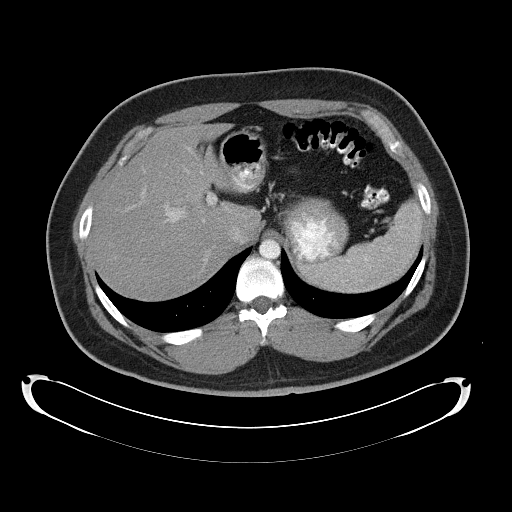
[im 86/139  bone]
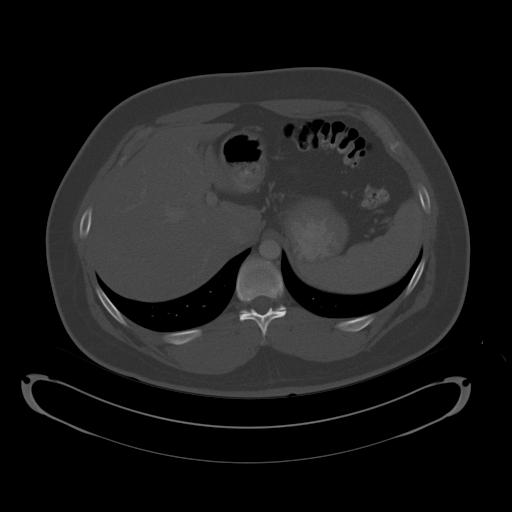
[im 93/139  soft-tissue]
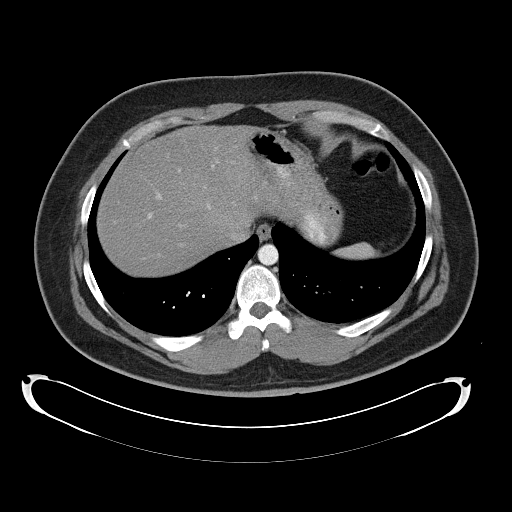
[im 106/139  soft-tissue]
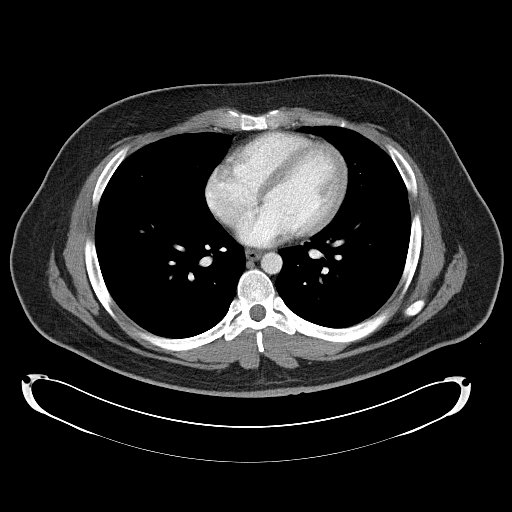
[im 112/139  soft-tissue]
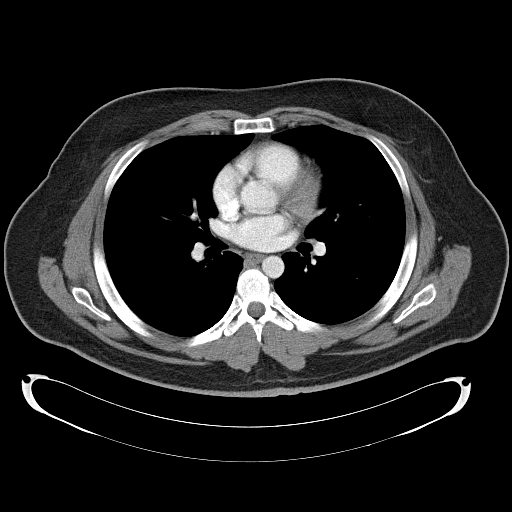
[im 119/139  soft-tissue]
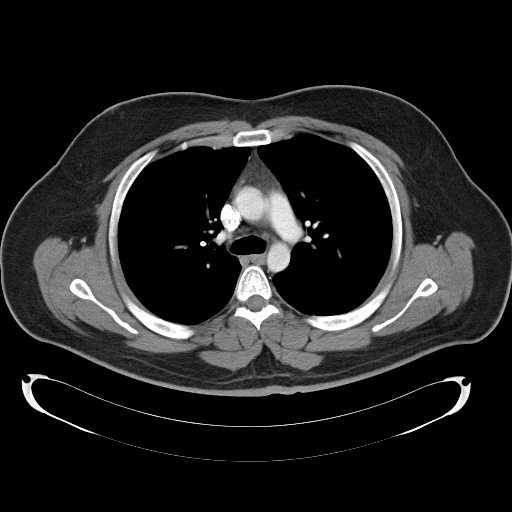
[im 132/139  soft-tissue]
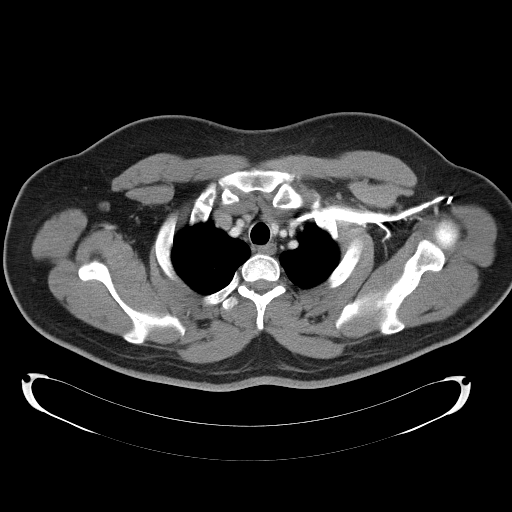

[Series 602: <mpr thick range> · coronal · 1.35mm/px · 3 of 91 slices shown]
[im 31/91  soft-tissue]
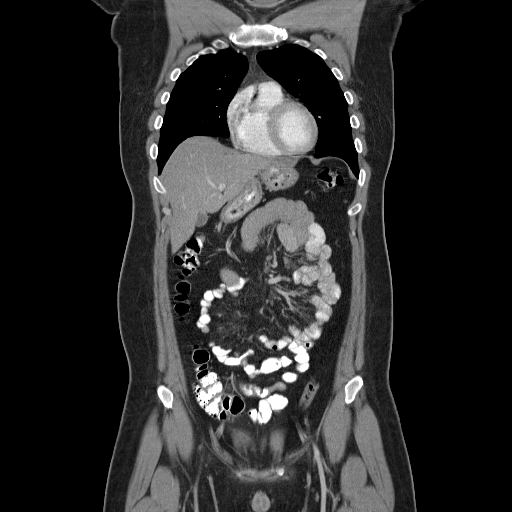
[im 41/91  soft-tissue]
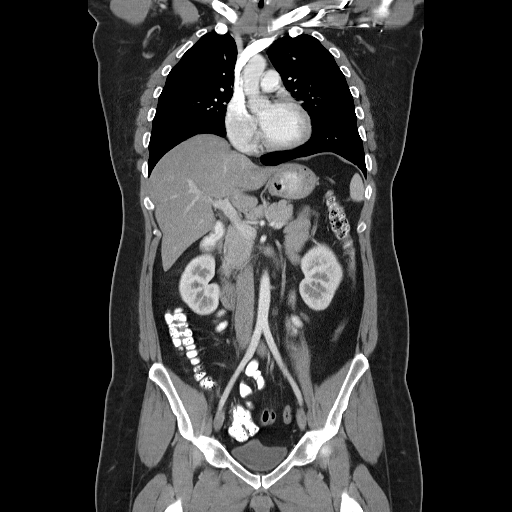
[im 51/91  soft-tissue]
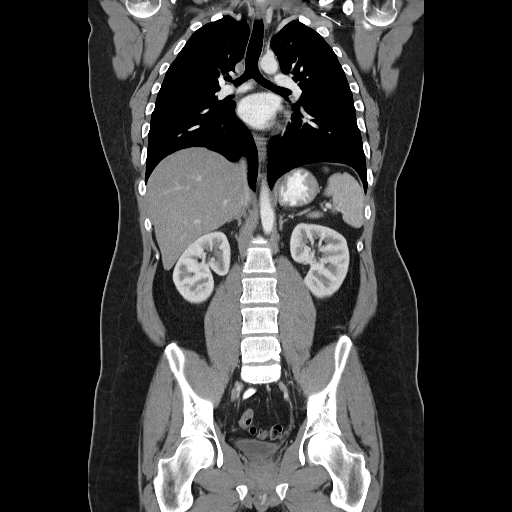

[17 of 46 positions shown; findings below may reference images not displayed]

FINDINGS: No evidence of mediastinal or hilar lymphadenopathy.  No
adenopathy seen elsewhere within the thorax.  No evidence of chest
wall mass or suspicious bone lesions.

Both lungs are clear.  No suspicious pulmonary nodules or masses
are identified.  No evidence of pulmonary infiltrate or central
endobronchial lesion.
IMPRESSION: Negative.  No evidence of neoplasm or other significant
abnormality.

CT ABDOMEN AND PELVIS
FINDINGS: The liver, gallbladder, pancreas, spleen, adrenal
glands, and kidneys are normal in appearance.  No evidence of
hydronephrosis.  No soft tissue masses or lymphadenopathy
identified within the abdomen or pelvis.

No evidence of inflammatory process or abnormal fluid collections.
No evidence of bowel wall thickening, dilatation, or hernia.  No
suspicious bone lesions identified.
IMPRESSION: Negative.  No evidence of neoplasm or other significant
abnormality.

## 2014-10-31 ENCOUNTER — Ambulatory Visit (INDEPENDENT_AMBULATORY_CARE_PROVIDER_SITE_OTHER): Payer: Self-pay | Admitting: Family Medicine

## 2014-10-31 VITALS — BP 120/88 | HR 96 | Temp 98.5°F | Resp 18 | Ht 70.0 in | Wt 232.6 lb

## 2014-10-31 DIAGNOSIS — Z0000 Encounter for general adult medical examination without abnormal findings: Secondary | ICD-10-CM

## 2014-10-31 DIAGNOSIS — Z021 Encounter for pre-employment examination: Secondary | ICD-10-CM

## 2014-10-31 NOTE — Progress Notes (Signed)
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 He's been in good health other than being a smoker and having developed some mild type 2 diabetes of the last 9 months. He was put on metformin and his A1c went from 10 to 6.4.  Objective: Patient's physical exam is totally normal  Assessment: Good for 1 year.

## 2022-08-15 ENCOUNTER — Encounter (HOSPITAL_BASED_OUTPATIENT_CLINIC_OR_DEPARTMENT_OTHER): Payer: Self-pay

## 2022-08-15 ENCOUNTER — Other Ambulatory Visit: Payer: Self-pay

## 2022-08-15 DIAGNOSIS — M79605 Pain in left leg: Secondary | ICD-10-CM | POA: Insufficient documentation

## 2022-08-15 DIAGNOSIS — M79652 Pain in left thigh: Secondary | ICD-10-CM | POA: Insufficient documentation

## 2022-08-15 NOTE — ED Triage Notes (Signed)
Pt via [facility] for eval of LLE pain x4 days. Seen for same at ED in [place], given norco, flexaril & keflex which pt states "didn't help, just made me sleep." Per report he's not eaten or drank "because of the pain."

## 2022-08-16 ENCOUNTER — Emergency Department (HOSPITAL_BASED_OUTPATIENT_CLINIC_OR_DEPARTMENT_OTHER)
Admission: EM | Admit: 2022-08-16 | Discharge: 2022-08-16 | Disposition: A | Discharge status: Home / Self Care | Payer: No Typology Code available for payment source | Attending: Emergency Medicine | Admitting: Emergency Medicine

## 2022-08-16 ENCOUNTER — Emergency Department (HOSPITAL_BASED_OUTPATIENT_CLINIC_OR_DEPARTMENT_OTHER): Payer: No Typology Code available for payment source

## 2022-08-16 DIAGNOSIS — M79652 Pain in left thigh: Secondary | ICD-10-CM

## 2022-08-16 MED ORDER — OXYCODONE-ACETAMINOPHEN 5-325 MG PO TABS: 1.0000 | ORAL_TABLET | Freq: Four times a day (QID) | ORAL | 0 refills | Status: AC | PRN

## 2022-08-16 MED ORDER — HYDROMORPHONE HCL 1 MG/ML IJ SOLN
2.0000 mg | Freq: Once | INTRAMUSCULAR | Status: AC
  Administered 2022-08-16: 2 mg via INTRAMUSCULAR
  Filled 2022-08-16: qty 2

## 2022-08-16 NOTE — ED Provider Notes (Signed)
[facility] DE[facility] Provider Note   CSN: [id number] Arrival date & time: 08/15/22  2227     History  Chief Complaint  Patient presents with   Leg Pain    L    Matthew Stephens is a 37 y.o. male.  Patient is a 37 year old male with history of polycythemia.  Patient presenting today with complaints of left leg pain.  Patient is a Administrator.  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  Patient underwent extensive workup there including multiple laboratory studies, ultrasound, and CT scan of the thigh.  All of these were unremarkable.  Patient was discharged with prednisone, Flexeril, and hydrocodone, but this pain persists.  He describes a severe pain to the back of the thigh in the hamstrings area that is worse when he attempts to move, stand, and walk.  He denies any numbness or tingling.  He denies any back pain.  The history is provided by the patient.       Home Medications Prior to Admission medications   Medication Sig Start Date End Date Taking? Authorizing Provider  metFORMIN (GLUCOPHAGE) 500 MG tablet Take 500 mg by mouth 2 (two) times daily with a meal.    [provider]      Allergies    Patient has no known allergies.    Review of Systems   Review of Systems  All other systems reviewed and are negative.   Physical Exam Updated Vital Signs BP 133/88 (BP Location: Right Arm)   Pulse 87   Temp 97.8 F (36.6 C) (Oral)   Resp 18   SpO2 100%  Physical Exam Vitals and nursing note reviewed.  Constitutional:      General: He is not in acute distress.    Appearance: Normal appearance. He is not ill-appearing.  HENT:     Head: Normocephalic and atraumatic.  Pulmonary:     Effort: Pulmonary effort is normal.  Musculoskeletal:     Comments: The left lower extremity appears grossly normal.  There is no swelling or deformity.  There is no knee  effusion.  There is tenderness to palpation to the posterior aspect of the thigh over the mid hamstrings, but no palpable abnormality.  DP pulses are easily palpable and motor and sensation are intact throughout the entire foot.  Skin:    General: Skin is warm and dry.  Neurological:     Mental Status: He is alert.     ED Results / Procedures / Treatments   Labs (all labs ordered are listed, but only abnormal results are displayed) Labs Reviewed - No data to display  EKG None  Radiology No results found.  Procedures Procedures    Medications Ordered in ED Medications  HYDROmorphone (DILAUDID) injection 2 mg (has no administration in time range)    ED Course/ Medical Decision Making/ A&P  Patient presenting here with complaints of severe left posterior thigh pain that has been present for the past 4 days.  He was seen in an outside hospital in [place] with similar complaints.  He underwent CT scan, ultrasound, laboratory studies, and given steroids and pain medicine, however his pain persists.  On exam, I see no obvious abnormality.  He has good range of motion of the hip and knee, but has tenderness over the posterior thigh.  He has good pulses in the foot and the whole leg is neurovascularly  intact.  I am uncertain as to the cause of this discomfort, however I feel as though emergent causes have been ruled out between his visit in [place] and this evening.  I did repeat an ultrasound due to his history of being a truck driver which would put him at risk for DVT, however this was again negative.  Patient will be discharged with an Ace wrap, crutches, Percocet, and is to follow-up if not improving.  Final Clinical Impression(s) / ED Diagnoses Final diagnoses:  None    Rx / DC Orders ED Discharge Orders     None         Veryl Speak, MD 08/16/22 0301

## 2022-08-16 NOTE — ED Notes (Signed)
Left thigh wrapped in large ace wrap. Pt given crutches and instruction, verbalized understanding and return demonstration.

## 2022-08-16 NOTE — Discharge Instructions (Signed)
Begin taking Percocet as prescribed as needed for pain.  Stop taking hydrocodone and do not take these medicines together.  Continue other medications as previously prescribed.  Follow-up with a primary doctor if symptoms or not improving through the weekend.
